# Patient Record
Sex: Male | Born: 1970 | Hispanic: No | Marital: Married | Smoking: Never smoker
Health system: Southern US, Community
[De-identification: ages and names within clinical notes are randomized; demographics above are authoritative.]

## PROBLEM LIST (undated history)

## (undated) DIAGNOSIS — G4730 Sleep apnea, unspecified: Secondary | ICD-10-CM

---

## 2015-03-09 ENCOUNTER — Emergency Department (HOSPITAL_COMMUNITY)
Admission: EM | Admit: 2015-03-09 | Discharge: 2015-03-09 | Disposition: A | Discharge status: Home / Self Care | Payer: BLUE CROSS/BLUE SHIELD | Attending: Emergency Medicine | Admitting: Emergency Medicine

## 2015-03-09 ENCOUNTER — Encounter (HOSPITAL_COMMUNITY): Payer: Self-pay | Admitting: Emergency Medicine

## 2015-03-09 ENCOUNTER — Emergency Department (HOSPITAL_COMMUNITY): Payer: BLUE CROSS/BLUE SHIELD

## 2015-03-09 DIAGNOSIS — R05 Cough: Secondary | ICD-10-CM | POA: Insufficient documentation

## 2015-03-09 DIAGNOSIS — R0602 Shortness of breath: Secondary | ICD-10-CM | POA: Insufficient documentation

## 2015-03-09 DIAGNOSIS — R079 Chest pain, unspecified: Secondary | ICD-10-CM

## 2015-03-09 DIAGNOSIS — R1013 Epigastric pain: Secondary | ICD-10-CM | POA: Insufficient documentation

## 2015-03-09 HISTORY — DX: Sleep apnea, unspecified: G47.30

## 2015-03-09 LAB — CBC
HEMATOCRIT: 48.3 % (ref 39.0–52.0)
Hemoglobin: 16.5 g/dL (ref 13.0–17.0)
MCH: 30.3 pg (ref 26.0–34.0)
MCHC: 34.2 g/dL (ref 30.0–36.0)
MCV: 88.8 fL (ref 78.0–100.0)
Platelets: 212 10*3/uL (ref 150–400)
RBC: 5.44 MIL/uL (ref 4.22–5.81)
RDW: 12.5 % (ref 11.5–15.5)
WBC: 6.3 10*3/uL (ref 4.0–10.5)

## 2015-03-09 LAB — LIPASE, BLOOD: Lipase: 25 U/L (ref 22–51)

## 2015-03-09 LAB — COMPREHENSIVE METABOLIC PANEL
ALT: 29 U/L (ref 17–63)
AST: 27 U/L (ref 15–41)
Albumin: 4 g/dL (ref 3.5–5.0)
Alkaline Phosphatase: 53 U/L (ref 38–126)
Anion gap: 8 (ref 5–15)
BILIRUBIN TOTAL: 0.7 mg/dL (ref 0.3–1.2)
BUN: 9 mg/dL (ref 6–20)
CALCIUM: 9.7 mg/dL (ref 8.9–10.3)
CO2: 25 mmol/L (ref 22–32)
CREATININE: 0.94 mg/dL (ref 0.61–1.24)
Chloride: 108 mmol/L (ref 101–111)
GFR calc Af Amer: >60 mL/min (ref >60)
GFR calc non Af Amer: >60 mL/min (ref >60)
Glucose, Bld: 125 mg/dL — ABNORMAL HIGH (ref 65–99)
Potassium: 4 mmol/L (ref 3.5–5.1)
Sodium: 141 mmol/L (ref 135–145)
Total Protein: 7 g/dL (ref 6.5–8.1)

## 2015-03-09 LAB — D-DIMER, QUANTITATIVE: D-Dimer, Quant: <0.27 ug/mL-FEU (ref 0.00–0.48)

## 2015-03-09 LAB — TROPONIN I: Troponin I: <0.03 ng/mL (ref <0.031)

## 2015-03-09 MED ORDER — PANTOPRAZOLE SODIUM 20 MG PO TBEC: 20.0000 mg | DELAYED_RELEASE_TABLET | Freq: Every day | ORAL | Status: AC

## 2015-03-09 MED ORDER — RANITIDINE HCL 150 MG PO CAPS: 150.0000 mg | ORAL_CAPSULE | Freq: Every day | ORAL | Status: AC

## 2015-03-09 MED ORDER — RANITIDINE HCL 150 MG/10ML PO SYRP
150.0000 mg | ORAL_SOLUTION | Freq: Once | ORAL | Status: AC
  Administered 2015-03-09: 150 mg via ORAL
  Filled 2015-03-09: qty 10

## 2015-03-09 MED ORDER — GI COCKTAIL ~~LOC~~
30.0000 mL | Freq: Once | ORAL | Status: AC
  Administered 2015-03-09: 30 mL via ORAL
  Filled 2015-03-09: qty 30

## 2015-03-09 MED ORDER — SODIUM CHLORIDE 0.9 % IV SOLN
1000.0000 mL | Freq: Once | INTRAVENOUS | Status: AC
  Administered 2015-03-09: 1000 mL via INTRAVENOUS

## 2015-03-09 NOTE — ED Provider Notes (Signed)
CSN: [id number]     Arrival date & time 03/09/15  1047 History   First MD Initiated Contact with Patient 03/09/15 1049     Chief Complaint  Patient presents with  . Chest Pain  . Shortness of Breath  . Nausea     (Consider location/radiation/quality/duration/timing/severity/associated sxs/prior Treatment) Patient is a 44 y.o. male presenting with abdominal pain.  Abdominal Pain Pain location:  Epigastric Pain quality: pressure and sharp   Pain radiates to:  Does not radiate Pain severity:  Mild Onset quality:  Gradual Duration:  1 day Timing:  Constant Progression:  Improving Chronicity:  Recurrent Context: not alcohol use, not eating, not previous surgeries and not recent illness   Relieved by:  None tried Worsened by:  Nothing tried Ineffective treatments:  OTC medications (TUMS) Associated symptoms: cough and shortness of breath   Associated symptoms: no anorexia, no chest pain, no chills, no fever, no melena and no nausea     Past Medical History  Diagnosis Date  . Sleep apnea    History reviewed. No pertinent past surgical history. No family history on file. History  Substance Use Topics  . Smoking status: Never Smoker   . Smokeless tobacco: Not on file  . Alcohol Use: No    Review of Systems  Constitutional: Negative for fever and chills.  Eyes: Negative for pain.  Respiratory: Positive for cough and shortness of breath.   Cardiovascular: Negative for chest pain.  Gastrointestinal: Positive for abdominal pain. Negative for nausea, melena and anorexia.  Skin: Negative for rash and wound.  All other systems reviewed and are negative.     Allergies  Clindamycin/lincomycin  Home Medications   Prior to Admission medications   Medication Sig Start Date End Date Taking? Authorizing Provider  acetaminophen (TYLENOL) 500 MG tablet Take 1,000 mg by mouth every 8 (eight) hours as needed for mild pain or moderate pain.   Yes Historical Provider, MD  lactase  (LACTAID) 3000 UNITS tablet Take 3,000 Units by mouth daily as needed.   Yes Historical Provider, MD  pantoprazole (PROTONIX) 20 MG tablet Take 1 tablet (20 mg total) by mouth daily. 03/09/15   Marily Memos, MD  ranitidine (ZANTAC) 150 MG capsule Take 1 capsule (150 mg total) by mouth daily. 03/09/15 03/16/15  Marily Memos, MD   BP 111/60 mmHg  Pulse 69  Temp(Src) 97.5 F (36.4 C)  Resp 18  Ht  (1.676 m)  Wt 205 lb (92.987 kg)  BMI 33.10 kg/m2  SpO2 98% Physical Exam  Constitutional: He is oriented to person, place, and time. He appears well-developed and well-nourished.  HENT:  Head: Normocephalic and atraumatic.  Eyes: Conjunctivae and EOM are normal.  Neck: Normal range of motion. Neck supple.  Cardiovascular: Normal rate and regular rhythm.   Pulmonary/Chest: Effort normal. No respiratory distress.  Abdominal: Soft. There is no tenderness.  Musculoskeletal: Normal range of motion. He exhibits no edema or tenderness.  Neurological: He is alert and oriented to person, place, and time.  Skin: Skin is warm and dry.  Nursing note and vitals reviewed.   ED Course  Procedures (including critical care time) Labs Review Labs Reviewed  COMPREHENSIVE METABOLIC PANEL - Abnormal; Notable for the following:    Glucose, Bld 125 (*)    All other components within normal limits  CBC  TROPONIN I  D-DIMER, QUANTITATIVE (NOT AT [facility])  LIPASE, BLOOD  TROPONIN I    Imaging Review Dg Chest 2 View  03/09/2015   CLINICAL  DATA:  Shortness of breath.  EXAM: CHEST  2 VIEW  COMPARISON:  None.  FINDINGS: The heart size and mediastinal contours are within normal limits. Both lungs are clear. No pneumothorax or pleural effusion is noted. The visualized skeletal structures are unremarkable.  IMPRESSION: No active cardiopulmonary disease.   Electronically Signed   By: Lupita Raider, M.D.   On: 03/09/2015 12:04     EKG Interpretation   Date/Time:  Thursday March 09 2015 10:51:15 EDT Ventricular  Rate:  97 PR Interval:  140 QRS Duration: 92 QT Interval:  348 QTC Calculation: 441 R Axis:   99 Text Interpretation:  Normal sinus rhythm with sinus arrhythmia Rightward  axis Borderline ECG Confirmed by Fidencio Duddy MD, Barbara Cower [phone]) on 03/09/2015  11:11:59 AM      MDM   Final diagnoses:  Chest pain, unspecified chest pain type    44 yo M w/ 'indigestion' in epigastric area yesterday not improving today. A/w SOB and nausea. Had before, not as long. Exam benign as above aside from intermittent tachypnea and sats to 93 when talking.   ddx includes gastritis, PUD, pancreatitis, ACS, PE, labs and imaging ordered to reflect this. Will also give GI cocktail and reassess for improvement.   All labs unremarkable. Pain improved with cocktail will FU w/ PCP for stress test. Low likelihood of ischemia at this time.   I have personally and contemperaneously reviewed labs and imaging and used in my decision making as above.   A medical screening exam was performed and I feel the patient has had an appropriate workup for their chief complaint at this time and likelihood of emergent condition existing is low. They have been counseled on decision, discharge, follow up and which symptoms necessitate immediate return to the emergency department. They or their family verbally stated understanding and agreement with plan and discharged in stable condition.      Marily Memos, MD 03/09/15 2101

## 2015-03-09 NOTE — ED Notes (Signed)
Pt. Stated, I started having chest pressure and I thought it was indigestion and I took TUMS and it didn't go away.  Ive had some SOB and nausea. I was really gassy last night and it never stopped.

## 2015-03-09 NOTE — ED Notes (Signed)
Pt returned from xray NAD.

## 2015-03-09 NOTE — ED Notes (Signed)
Pt transporting to xray. NAD.  

## 2015-03-15 ENCOUNTER — Other Ambulatory Visit: Payer: Self-pay | Admitting: *Deleted

## 2015-03-15 DIAGNOSIS — R0789 Other chest pain: Secondary | ICD-10-CM

## 2015-03-21 ENCOUNTER — Ambulatory Visit: Payer: BLUE CROSS/BLUE SHIELD | Admitting: Sports Medicine

## 2015-03-28 ENCOUNTER — Ambulatory Visit (INDEPENDENT_AMBULATORY_CARE_PROVIDER_SITE_OTHER): Payer: BLUE CROSS/BLUE SHIELD | Admitting: Sports Medicine

## 2015-03-28 ENCOUNTER — Ambulatory Visit (HOSPITAL_COMMUNITY)
Admission: RE | Admit: 2015-03-28 | Discharge: 2015-03-28 | Disposition: A | Discharge status: Home / Self Care | Payer: BLUE CROSS/BLUE SHIELD | Source: Ambulatory Visit | Attending: Sports Medicine | Admitting: Sports Medicine

## 2015-03-28 DIAGNOSIS — R079 Chest pain, unspecified: Secondary | ICD-10-CM | POA: Insufficient documentation

## 2015-03-28 DIAGNOSIS — R0789 Other chest pain: Secondary | ICD-10-CM | POA: Diagnosis present

## 2015-03-28 LAB — EXERCISE TOLERANCE TEST
CHL CUP MPHR: 177 {beats}/min
CHL CUP RESTING HR STRESS: 71 {beats}/min
CSEPHR: 107 %
Estimated workload: 13.4 METS
Exercise duration (min): 11 min
Exercise duration (sec): 5 s
Peak HR: 190 {beats}/min

## 2015-03-28 NOTE — Progress Notes (Signed)
Patient ID: Shaun Ewing, male   DOB: February 17, 1971, 44 y.o.   MRN: [id number]  For ETT because of chest pain warranting ED visit.  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

## 2016-01-16 IMAGING — DX DG CHEST 2V
2 series · 2 of 2 positions shown · non-contrast
Comparison: None.

CLINICAL DATA: Shortness of breath.

EXAM:
CHEST  2 VIEW

[chest pa]
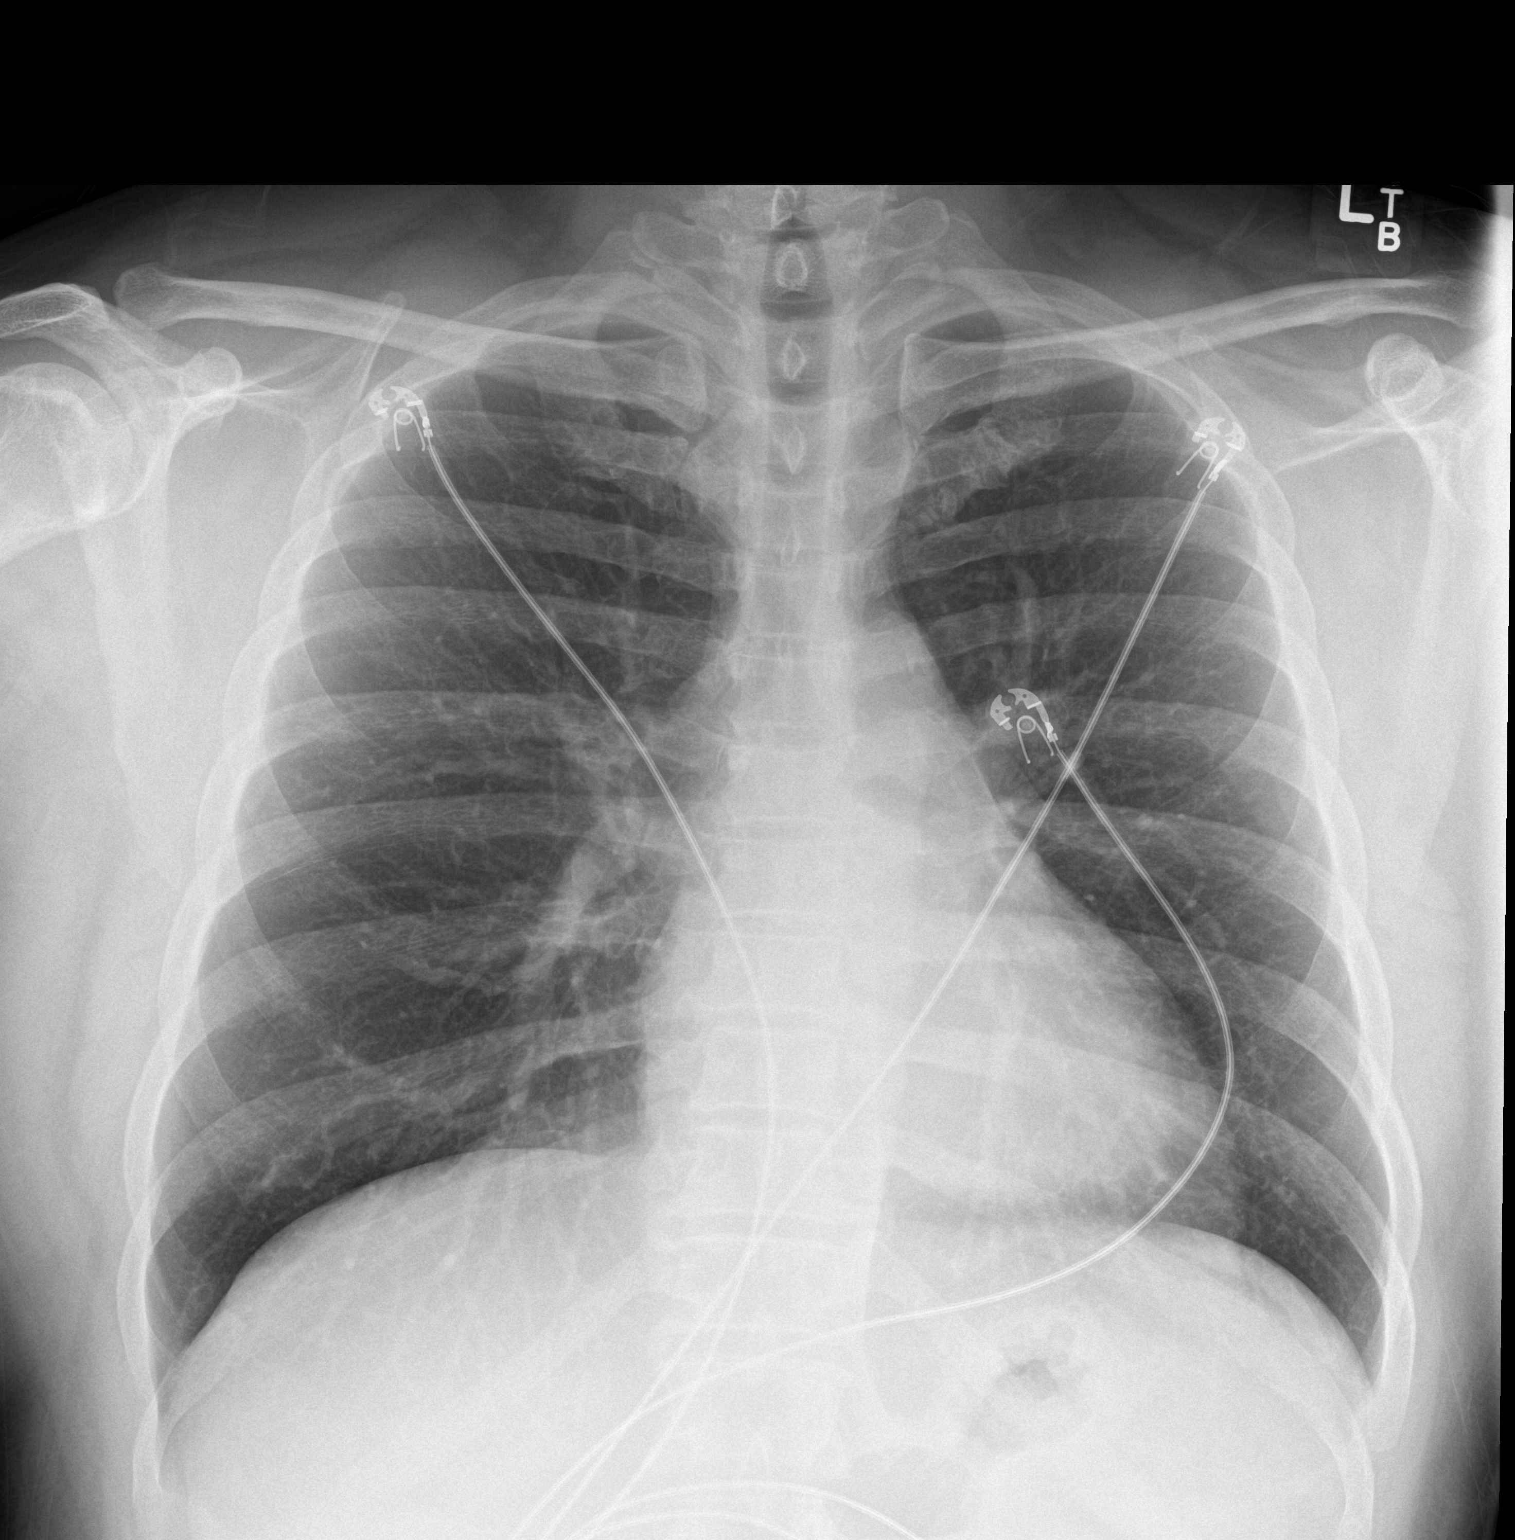

[chest lat]
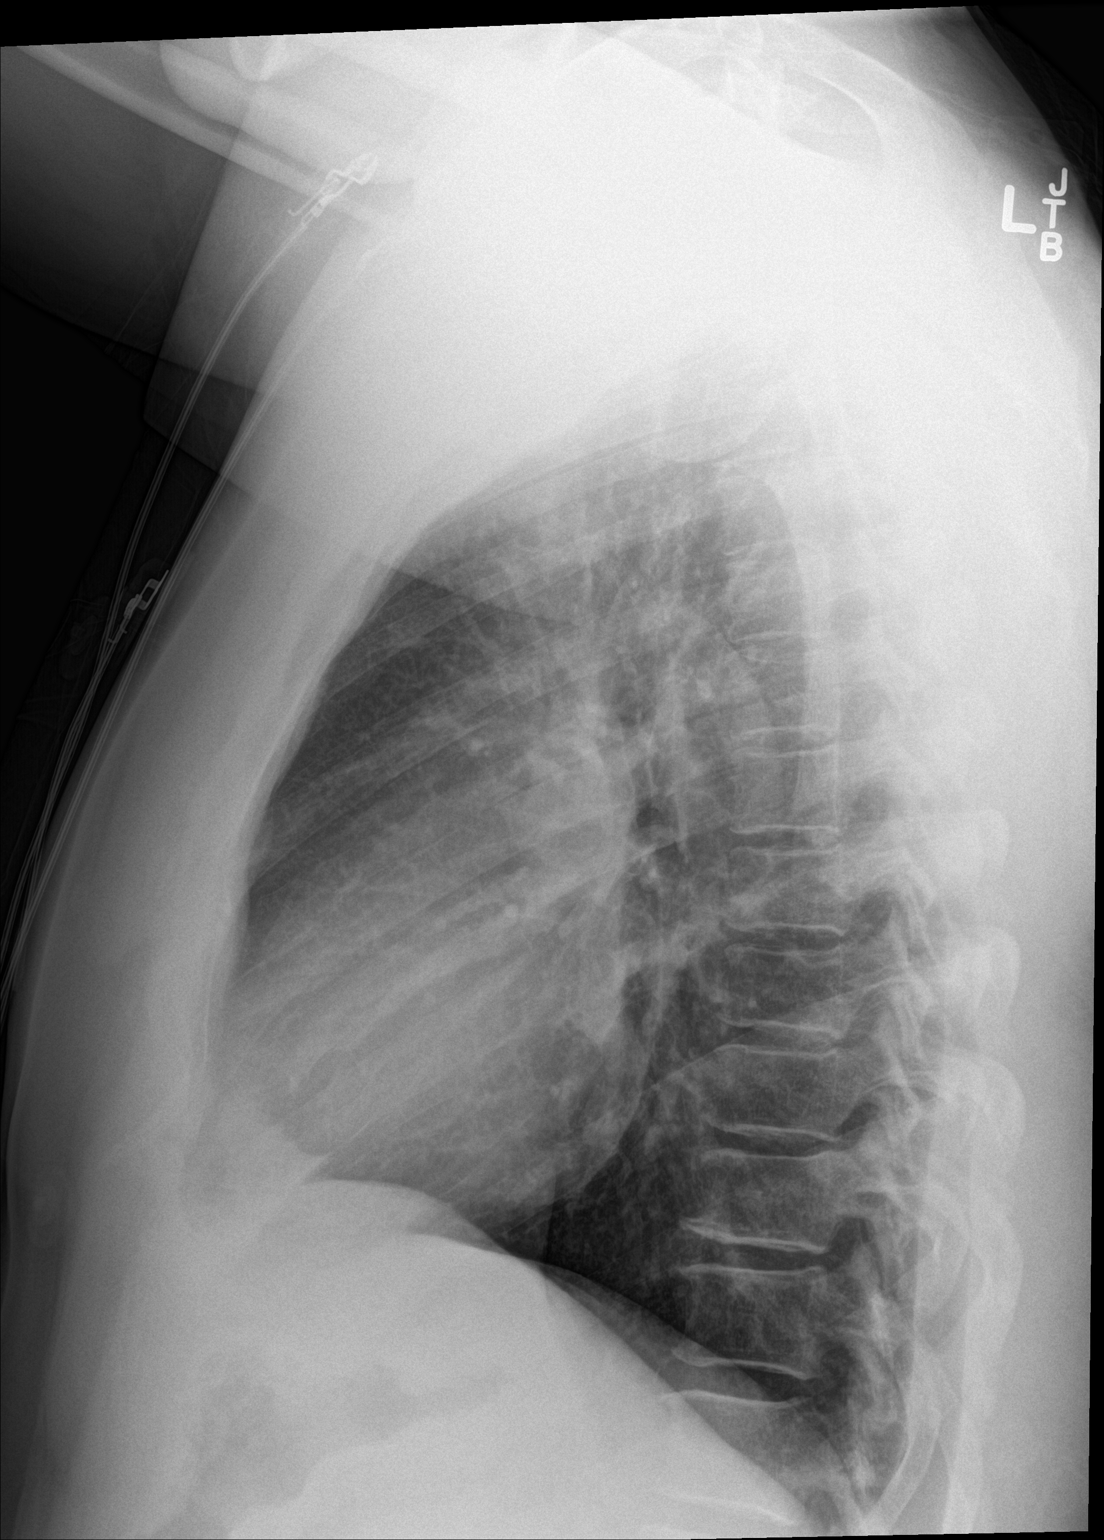

[2 of 2 positions shown; findings below may reference images not displayed]

FINDINGS: The heart size and mediastinal contours are within normal limits.
Both lungs are clear. No pneumothorax or pleural effusion is noted.
The visualized skeletal structures are unremarkable.
IMPRESSION: No active cardiopulmonary disease.
